# Patient Record
Sex: Female | Born: 1980 | Race: Black or African American | Hispanic: No | Marital: Single | Smoking: Never smoker
Health system: Southern US, Community
[De-identification: ages and names within clinical notes are randomized; demographics above are authoritative.]

## PROBLEM LIST (undated history)

## (undated) DIAGNOSIS — F419 Anxiety disorder, unspecified: Secondary | ICD-10-CM

## (undated) HISTORY — DX: Anxiety disorder, unspecified: F41.9

---

## 2017-08-30 DIAGNOSIS — Z9109 Other allergy status, other than to drugs and biological substances: Secondary | ICD-10-CM | POA: Insufficient documentation

## 2017-08-30 DIAGNOSIS — Z833 Family history of diabetes mellitus: Secondary | ICD-10-CM | POA: Insufficient documentation

## 2021-10-05 ENCOUNTER — Other Ambulatory Visit: Payer: Self-pay | Admitting: Nurse Practitioner

## 2021-10-05 ENCOUNTER — Ambulatory Visit
Admission: EM | Admit: 2021-10-05 | Discharge: 2021-10-05 | Disposition: A | Discharge status: Home / Self Care | Payer: Federal, State, Local not specified - PPO | Attending: Emergency Medicine | Admitting: Emergency Medicine

## 2021-10-05 ENCOUNTER — Other Ambulatory Visit (HOSPITAL_COMMUNITY)
Admission: RE | Admit: 2021-10-05 | Discharge: 2021-10-05 | Disposition: A | Discharge status: Home / Self Care | Payer: Federal, State, Local not specified - PPO | Source: Ambulatory Visit | Attending: Nurse Practitioner | Admitting: Nurse Practitioner

## 2021-10-05 ENCOUNTER — Ambulatory Visit (INDEPENDENT_AMBULATORY_CARE_PROVIDER_SITE_OTHER): Payer: Federal, State, Local not specified - PPO

## 2021-10-05 DIAGNOSIS — S300XXA Contusion of lower back and pelvis, initial encounter: Secondary | ICD-10-CM

## 2021-10-05 DIAGNOSIS — M545 Low back pain, unspecified: Secondary | ICD-10-CM | POA: Diagnosis not present

## 2021-10-05 DIAGNOSIS — W182XXA Fall in (into) shower or empty bathtub, initial encounter: Secondary | ICD-10-CM | POA: Diagnosis not present

## 2021-10-05 DIAGNOSIS — Z124 Encounter for screening for malignant neoplasm of cervix: Secondary | ICD-10-CM | POA: Diagnosis not present

## 2021-10-05 DIAGNOSIS — Z043 Encounter for examination and observation following other accident: Secondary | ICD-10-CM | POA: Diagnosis not present

## 2021-10-05 DIAGNOSIS — Z01419 Encounter for gynecological examination (general) (routine) without abnormal findings: Secondary | ICD-10-CM | POA: Diagnosis not present

## 2021-10-05 NOTE — ED Provider Notes (Signed)
[facility]    CSN: [id number] Arrival date & time: 10/05/21  1557    HISTORY   Chief Complaint  Patient presents with   Fall   HPI Angelica Dalton is a 41 y.o. female. Patient presents to urgent care today stating that she fell in her shower a week ago and continues to have pain at her sacrum.  Patient states she is concerned that she broke her tailbone.  Patient denies pain in her upper and lower extremities otherwise.  Patient states she fell so fast that she did not have time to catch herself and is thankful that she did not hit her head on the toilet.  Patient states she is never fallen in the shower before.  Patient denies dizziness, loss of consciousness or shortness of breath associated with the fall.  The history is provided by the patient.   History reviewed. No pertinent past medical history. There are no problems to display for this patient.  History reviewed. No pertinent surgical history. OB History   No obstetric history on file.    Home Medications    Prior to Admission medications   Not on File    Family History History reviewed. No pertinent family history. Social History Social History   Tobacco Use   Smoking status: Never   Smokeless tobacco: Never  Vaping Use   Vaping Use: Never used  Substance Use Topics   Alcohol use: Yes   Drug use: Yes    Types: Marijuana   Allergies   Patient has no allergy information on record.  Review of Systems Review of Systems Pertinent findings noted in history of present illness.   Physical Exam Triage Vital Signs ED Triage Vitals  Enc Vitals Group     BP 02/20/21 0827 (!) 147/82     Pulse Rate 02/20/21 0827 72     Resp 02/20/21 0827 18     Temp 02/20/21 0827 98.3 F (36.8 C)     Temp Source 02/20/21 0827 Oral     SpO2 02/20/21 0827 98 %     Weight --      Height --      Head Circumference --      Peak Flow --      Pain Score 02/20/21 0826 5     Pain Loc --      Pain Edu? --       Excl. in GC? --   No data found.  Updated Vital Signs BP 126/86 (BP Location: Left Arm)   Pulse 83   Temp 99 F (37.2 C) (Oral)   Resp 16   LMP 09/15/2021   SpO2 97%   Physical Exam Vitals and nursing note reviewed.  Constitutional:      General: She is not in acute distress.    Appearance: Normal appearance. She is obese. She is not ill-appearing.  HENT:     Head: Normocephalic and atraumatic.  Eyes:     General: Lids are normal.        Right eye: No discharge.        Left eye: No discharge.     Extraocular Movements: Extraocular movements intact.     Conjunctiva/sclera: Conjunctivae normal.     Right eye: Right conjunctiva is not injected.     Left eye: Left conjunctiva is not injected.  Neck:     Trachea: Trachea and phonation normal.  Cardiovascular:     Rate and Rhythm: Normal rate and regular rhythm.  Pulses: Normal pulses.     Heart sounds: Normal heart sounds. No murmur heard.    No friction rub. No gallop.  Pulmonary:     Effort: Pulmonary effort is normal. No accessory muscle usage, prolonged expiration or respiratory distress.     Breath sounds: Normal breath sounds. No stridor, decreased air movement or transmitted upper airway sounds. No decreased breath sounds, wheezing, rhonchi or rales.  Chest:     Chest wall: No tenderness.  Musculoskeletal:        General: Normal range of motion.     Cervical back: Normal range of motion and neck supple. Normal range of motion.     Lumbar back: Tenderness (Ecchymoses at base of spine) and bony tenderness present. No swelling, edema, deformity, signs of trauma, lacerations or spasms. Normal range of motion. Negative right straight leg raise test and negative left straight leg raise test. No scoliosis.  Lymphadenopathy:     Cervical: No cervical adenopathy.  Skin:    General: Skin is warm and dry.     Findings: No erythema or rash.  Neurological:     General: No focal deficit present.     Mental Status: She is  alert and oriented to person, place, and time.  Psychiatric:        Mood and Affect: Mood normal.        Behavior: Behavior normal.     Visual Acuity Right Eye Distance:   Left Eye Distance:   Bilateral Distance:    Right Eye Near:   Left Eye Near:    Bilateral Near:     [facility] Couse / Diagnostics / Procedures:    EKG  Radiology DG Lumbar Spine Complete  Result Date: 10/05/2021 CLINICAL DATA:  Fall. EXAM: LUMBAR SPINE - COMPLETE 4+ VIEW COMPARISON:  None Available. FINDINGS: There is no evidence of lumbar spine fracture. Alignment is normal. Intervertebral disc spaces are maintained. IMPRESSION: Negative. Electronically Signed   By: Darliss CheneyAmy  Guttmann M.D.   On: 10/05/2021 17:28    Procedures Procedures (including critical care time)  UC Diagnoses / Final Clinical Impressions(s)   I have reviewed the triage vital signs and the nursing notes.  Pertinent labs & imaging results that were available during my care of the patient were reviewed by me and considered in my medical decision making (see chart for details).    Final diagnoses:  Fall in shower  Contusion of sacrum, initial encounter    Patient was advised of negative x-ray findings, recommended conservative care with ice, heat, anti-inflammatory pain medication.  Return precautions advised. ED Prescriptions   None    PDMP not reviewed this encounter.  Pending results:  Labs Reviewed - No data to display  Medications Ordered in UC: Medications - No data to display  Disposition Upon Discharge:  Condition: stable for discharge home Home: take medications as prescribed; routine discharge instructions as discussed; follow up as advised.  Patient presented with an acute illness with associated systemic symptoms and significant discomfort requiring urgent management. In my opinion, this is a condition that a prudent lay person (someone who possesses an average knowledge of health and medicine) may potentially expect to  result in complications if not addressed urgently such as respiratory distress, impairment of bodily function or dysfunction of bodily organs.   Routine symptom specific, illness specific and/or disease specific instructions were discussed with the patient and/or caregiver at length.   As such, the patient has been evaluated and assessed, work-up was performed and treatment  was provided in alignment with urgent care protocols and evidence based medicine.  Patient/parent/caregiver has been advised that the patient may require follow up for further testing and treatment if the symptoms continue in spite of treatment, as clinically indicated and appropriate.  If the patient was tested for COVID-19, Influenza and/or RSV, then the patient/parent/guardian was advised to isolate at home pending the results of his/her diagnostic coronavirus test and potentially longer if they're positive. I have also advised pt that if his/her COVID-19 test returns positive, it's recommended to self-isolate for at least 10 days after symptoms first appeared AND until fever-free for 24 hours without fever reducer AND other symptoms have improved or resolved. Discussed self-isolation recommendations as well as instructions for household member/close contacts as per the [facility] and [place] DHHS, and also gave patient the COVID packet with this information.  Patient/parent/caregiver has been advised to return to the [facility] or PCP in 3-5 days if no better; to PCP or the Emergency Department if new signs and symptoms develop, or if the current signs or symptoms continue to change or worsen for further workup, evaluation and treatment as clinically indicated and appropriate  The patient will follow up with their current PCP if and as advised. If the patient does not currently have a PCP we will assist them in obtaining one.   The patient may need specialty follow up if the symptoms continue, in spite of conservative treatment and management, for  further workup, evaluation, consultation and treatment as clinically indicated and appropriate.   Patient/parent/caregiver verbalized understanding and agreement of plan as discussed.  All questions were addressed during visit.  Please see discharge instructions below for further details of plan.  Discharge Instructions:   Discharge Instructions      The x-ray of your lumbar spine and sacrum is not concerning for any bony injury.  Your hip bones and tailbone's are also intact.  It is very likely that you have a significant amount of bruising in the soft tissue and muscles surrounding your tailbone which is a source of your pain.  It is not surprising that after a week your still having pain because sometimes bruising like this can take several weeks to heal.  As we discussed, I do believe that ice at this point may be more beneficial than heat but if you find that alternating these 2 every hour provided you with relief I think that would be fine.  Do not recommend using heat only.  You are welcome to continue using ibuprofen and Tylenol as needed for pain.  Try to avoid being seated for long periods of time or lying on your back.  Please follow-up with your primary care provider if you still have not had meaningful relief of your pain in the next 7 to 10 days.  Thank you for visiting urgent care today.      This office note has been dictated using Teaching laboratory technician.  Unfortunately, and despite my best efforts, this method of dictation can sometimes lead to occasional typographical or grammatical errors.  I apologize in advance if this occurs.     Theadora Rama Scales, PA-C 10/05/21 1737

## 2021-10-05 NOTE — Discharge Instructions (Signed)
The x-ray of your lumbar spine and sacrum is not concerning for any bony injury.  Your hip bones and tailbone's are also intact.  It is very likely that you have a significant amount of bruising in the soft tissue and muscles surrounding your tailbone which is a source of your pain.  It is not surprising that after a week your still having pain because sometimes bruising like this can take several weeks to heal.  As we discussed, I do believe that ice at this point may be more beneficial than heat but if you find that alternating these 2 every hour provided you with relief I think that would be fine.  Do not recommend using heat only.  You are welcome to continue using ibuprofen and Tylenol as needed for pain.  Try to avoid being seated for long periods of time or lying on your back.  Please follow-up with your primary care provider if you still have not had meaningful relief of your pain in the next 7 to 10 days.  Thank you for visiting urgent care today.

## 2021-10-05 NOTE — ED Triage Notes (Signed)
Pt states she had a fall (fell on her buttocks) about a week ago and is still having pain to the area.

## 2021-10-06 LAB — CYTOLOGY - PAP
Comment: NEGATIVE
Diagnosis: NEGATIVE
High risk HPV: NEGATIVE

## 2021-10-08 DIAGNOSIS — N852 Hypertrophy of uterus: Secondary | ICD-10-CM | POA: Diagnosis not present

## 2021-10-09 ENCOUNTER — Other Ambulatory Visit: Payer: Self-pay | Admitting: Nurse Practitioner

## 2021-10-09 DIAGNOSIS — R1900 Intra-abdominal and pelvic swelling, mass and lump, unspecified site: Secondary | ICD-10-CM

## 2021-11-05 ENCOUNTER — Inpatient Hospital Stay: Admission: RE | Admit: 2021-11-05 | Payer: Federal, State, Local not specified - PPO | Source: Ambulatory Visit

## 2021-11-11 DIAGNOSIS — M47817 Spondylosis without myelopathy or radiculopathy, lumbosacral region: Secondary | ICD-10-CM | POA: Diagnosis not present

## 2021-11-11 DIAGNOSIS — M542 Cervicalgia: Secondary | ICD-10-CM | POA: Diagnosis not present

## 2021-11-11 DIAGNOSIS — M9903 Segmental and somatic dysfunction of lumbar region: Secondary | ICD-10-CM | POA: Diagnosis not present

## 2021-11-11 DIAGNOSIS — M9904 Segmental and somatic dysfunction of sacral region: Secondary | ICD-10-CM | POA: Diagnosis not present

## 2021-11-12 DIAGNOSIS — M9903 Segmental and somatic dysfunction of lumbar region: Secondary | ICD-10-CM | POA: Diagnosis not present

## 2021-11-12 DIAGNOSIS — M47817 Spondylosis without myelopathy or radiculopathy, lumbosacral region: Secondary | ICD-10-CM | POA: Diagnosis not present

## 2021-11-12 DIAGNOSIS — M9904 Segmental and somatic dysfunction of sacral region: Secondary | ICD-10-CM | POA: Diagnosis not present

## 2021-11-12 DIAGNOSIS — M542 Cervicalgia: Secondary | ICD-10-CM | POA: Diagnosis not present

## 2021-11-16 DIAGNOSIS — M9904 Segmental and somatic dysfunction of sacral region: Secondary | ICD-10-CM | POA: Diagnosis not present

## 2021-11-16 DIAGNOSIS — M47817 Spondylosis without myelopathy or radiculopathy, lumbosacral region: Secondary | ICD-10-CM | POA: Diagnosis not present

## 2021-11-16 DIAGNOSIS — M542 Cervicalgia: Secondary | ICD-10-CM | POA: Diagnosis not present

## 2021-11-16 DIAGNOSIS — M9903 Segmental and somatic dysfunction of lumbar region: Secondary | ICD-10-CM | POA: Diagnosis not present

## 2021-11-18 DIAGNOSIS — M47817 Spondylosis without myelopathy or radiculopathy, lumbosacral region: Secondary | ICD-10-CM | POA: Diagnosis not present

## 2021-11-18 DIAGNOSIS — M542 Cervicalgia: Secondary | ICD-10-CM | POA: Diagnosis not present

## 2021-11-18 DIAGNOSIS — M9904 Segmental and somatic dysfunction of sacral region: Secondary | ICD-10-CM | POA: Diagnosis not present

## 2021-11-18 DIAGNOSIS — M9903 Segmental and somatic dysfunction of lumbar region: Secondary | ICD-10-CM | POA: Diagnosis not present

## 2021-11-19 DIAGNOSIS — M542 Cervicalgia: Secondary | ICD-10-CM | POA: Diagnosis not present

## 2021-11-19 DIAGNOSIS — M9904 Segmental and somatic dysfunction of sacral region: Secondary | ICD-10-CM | POA: Diagnosis not present

## 2021-11-19 DIAGNOSIS — M47817 Spondylosis without myelopathy or radiculopathy, lumbosacral region: Secondary | ICD-10-CM | POA: Diagnosis not present

## 2021-11-19 DIAGNOSIS — M9903 Segmental and somatic dysfunction of lumbar region: Secondary | ICD-10-CM | POA: Diagnosis not present

## 2021-11-23 DIAGNOSIS — M9904 Segmental and somatic dysfunction of sacral region: Secondary | ICD-10-CM | POA: Diagnosis not present

## 2021-11-23 DIAGNOSIS — M47817 Spondylosis without myelopathy or radiculopathy, lumbosacral region: Secondary | ICD-10-CM | POA: Diagnosis not present

## 2021-11-23 DIAGNOSIS — M9903 Segmental and somatic dysfunction of lumbar region: Secondary | ICD-10-CM | POA: Diagnosis not present

## 2021-11-23 DIAGNOSIS — M542 Cervicalgia: Secondary | ICD-10-CM | POA: Diagnosis not present

## 2021-11-25 DIAGNOSIS — M9903 Segmental and somatic dysfunction of lumbar region: Secondary | ICD-10-CM | POA: Diagnosis not present

## 2021-11-25 DIAGNOSIS — M542 Cervicalgia: Secondary | ICD-10-CM | POA: Diagnosis not present

## 2021-11-25 DIAGNOSIS — M47817 Spondylosis without myelopathy or radiculopathy, lumbosacral region: Secondary | ICD-10-CM | POA: Diagnosis not present

## 2021-11-25 DIAGNOSIS — M9904 Segmental and somatic dysfunction of sacral region: Secondary | ICD-10-CM | POA: Diagnosis not present

## 2021-11-30 DIAGNOSIS — M9904 Segmental and somatic dysfunction of sacral region: Secondary | ICD-10-CM | POA: Diagnosis not present

## 2021-11-30 DIAGNOSIS — M542 Cervicalgia: Secondary | ICD-10-CM | POA: Diagnosis not present

## 2021-11-30 DIAGNOSIS — M9903 Segmental and somatic dysfunction of lumbar region: Secondary | ICD-10-CM | POA: Diagnosis not present

## 2021-11-30 DIAGNOSIS — M47817 Spondylosis without myelopathy or radiculopathy, lumbosacral region: Secondary | ICD-10-CM | POA: Diagnosis not present

## 2021-12-07 DIAGNOSIS — M9904 Segmental and somatic dysfunction of sacral region: Secondary | ICD-10-CM | POA: Diagnosis not present

## 2021-12-07 DIAGNOSIS — M542 Cervicalgia: Secondary | ICD-10-CM | POA: Diagnosis not present

## 2021-12-07 DIAGNOSIS — M9903 Segmental and somatic dysfunction of lumbar region: Secondary | ICD-10-CM | POA: Diagnosis not present

## 2021-12-07 DIAGNOSIS — M47817 Spondylosis without myelopathy or radiculopathy, lumbosacral region: Secondary | ICD-10-CM | POA: Diagnosis not present

## 2021-12-09 DIAGNOSIS — M542 Cervicalgia: Secondary | ICD-10-CM | POA: Diagnosis not present

## 2021-12-09 DIAGNOSIS — M47817 Spondylosis without myelopathy or radiculopathy, lumbosacral region: Secondary | ICD-10-CM | POA: Diagnosis not present

## 2021-12-09 DIAGNOSIS — M9904 Segmental and somatic dysfunction of sacral region: Secondary | ICD-10-CM | POA: Diagnosis not present

## 2021-12-09 DIAGNOSIS — M9903 Segmental and somatic dysfunction of lumbar region: Secondary | ICD-10-CM | POA: Diagnosis not present

## 2021-12-14 DIAGNOSIS — M542 Cervicalgia: Secondary | ICD-10-CM | POA: Diagnosis not present

## 2021-12-14 DIAGNOSIS — M9903 Segmental and somatic dysfunction of lumbar region: Secondary | ICD-10-CM | POA: Diagnosis not present

## 2021-12-14 DIAGNOSIS — M9904 Segmental and somatic dysfunction of sacral region: Secondary | ICD-10-CM | POA: Diagnosis not present

## 2021-12-14 DIAGNOSIS — M47817 Spondylosis without myelopathy or radiculopathy, lumbosacral region: Secondary | ICD-10-CM | POA: Diagnosis not present

## 2021-12-21 DIAGNOSIS — M9904 Segmental and somatic dysfunction of sacral region: Secondary | ICD-10-CM | POA: Diagnosis not present

## 2021-12-21 DIAGNOSIS — M47817 Spondylosis without myelopathy or radiculopathy, lumbosacral region: Secondary | ICD-10-CM | POA: Diagnosis not present

## 2021-12-21 DIAGNOSIS — M542 Cervicalgia: Secondary | ICD-10-CM | POA: Diagnosis not present

## 2021-12-21 DIAGNOSIS — M9903 Segmental and somatic dysfunction of lumbar region: Secondary | ICD-10-CM | POA: Diagnosis not present

## 2021-12-23 DIAGNOSIS — M9904 Segmental and somatic dysfunction of sacral region: Secondary | ICD-10-CM | POA: Diagnosis not present

## 2021-12-23 DIAGNOSIS — M542 Cervicalgia: Secondary | ICD-10-CM | POA: Diagnosis not present

## 2021-12-23 DIAGNOSIS — M9903 Segmental and somatic dysfunction of lumbar region: Secondary | ICD-10-CM | POA: Diagnosis not present

## 2021-12-23 DIAGNOSIS — M47817 Spondylosis without myelopathy or radiculopathy, lumbosacral region: Secondary | ICD-10-CM | POA: Diagnosis not present

## 2022-01-18 DIAGNOSIS — M9903 Segmental and somatic dysfunction of lumbar region: Secondary | ICD-10-CM | POA: Diagnosis not present

## 2022-01-18 DIAGNOSIS — M9904 Segmental and somatic dysfunction of sacral region: Secondary | ICD-10-CM | POA: Diagnosis not present

## 2022-01-18 DIAGNOSIS — M542 Cervicalgia: Secondary | ICD-10-CM | POA: Diagnosis not present

## 2022-01-18 DIAGNOSIS — M47817 Spondylosis without myelopathy or radiculopathy, lumbosacral region: Secondary | ICD-10-CM | POA: Diagnosis not present

## 2022-01-21 DIAGNOSIS — M9903 Segmental and somatic dysfunction of lumbar region: Secondary | ICD-10-CM | POA: Diagnosis not present

## 2022-01-21 DIAGNOSIS — M47817 Spondylosis without myelopathy or radiculopathy, lumbosacral region: Secondary | ICD-10-CM | POA: Diagnosis not present

## 2022-01-21 DIAGNOSIS — M9904 Segmental and somatic dysfunction of sacral region: Secondary | ICD-10-CM | POA: Diagnosis not present

## 2022-01-21 DIAGNOSIS — M542 Cervicalgia: Secondary | ICD-10-CM | POA: Diagnosis not present

## 2022-01-25 DIAGNOSIS — M9903 Segmental and somatic dysfunction of lumbar region: Secondary | ICD-10-CM | POA: Diagnosis not present

## 2022-01-25 DIAGNOSIS — M47817 Spondylosis without myelopathy or radiculopathy, lumbosacral region: Secondary | ICD-10-CM | POA: Diagnosis not present

## 2022-01-25 DIAGNOSIS — M542 Cervicalgia: Secondary | ICD-10-CM | POA: Diagnosis not present

## 2022-01-25 DIAGNOSIS — M9904 Segmental and somatic dysfunction of sacral region: Secondary | ICD-10-CM | POA: Diagnosis not present

## 2022-03-02 DIAGNOSIS — M542 Cervicalgia: Secondary | ICD-10-CM | POA: Diagnosis not present

## 2022-03-02 DIAGNOSIS — M9903 Segmental and somatic dysfunction of lumbar region: Secondary | ICD-10-CM | POA: Diagnosis not present

## 2022-03-02 DIAGNOSIS — M9904 Segmental and somatic dysfunction of sacral region: Secondary | ICD-10-CM | POA: Diagnosis not present

## 2022-03-02 DIAGNOSIS — M47817 Spondylosis without myelopathy or radiculopathy, lumbosacral region: Secondary | ICD-10-CM | POA: Diagnosis not present

## 2022-03-08 DIAGNOSIS — M9904 Segmental and somatic dysfunction of sacral region: Secondary | ICD-10-CM | POA: Diagnosis not present

## 2022-03-08 DIAGNOSIS — M9903 Segmental and somatic dysfunction of lumbar region: Secondary | ICD-10-CM | POA: Diagnosis not present

## 2022-03-08 DIAGNOSIS — M47817 Spondylosis without myelopathy or radiculopathy, lumbosacral region: Secondary | ICD-10-CM | POA: Diagnosis not present

## 2022-03-08 DIAGNOSIS — M542 Cervicalgia: Secondary | ICD-10-CM | POA: Diagnosis not present

## 2022-10-25 DIAGNOSIS — M9904 Segmental and somatic dysfunction of sacral region: Secondary | ICD-10-CM | POA: Diagnosis not present

## 2022-10-25 DIAGNOSIS — M542 Cervicalgia: Secondary | ICD-10-CM | POA: Diagnosis not present

## 2022-10-25 DIAGNOSIS — M47817 Spondylosis without myelopathy or radiculopathy, lumbosacral region: Secondary | ICD-10-CM | POA: Diagnosis not present

## 2022-10-25 DIAGNOSIS — M9903 Segmental and somatic dysfunction of lumbar region: Secondary | ICD-10-CM | POA: Diagnosis not present

## 2022-11-08 DIAGNOSIS — M9904 Segmental and somatic dysfunction of sacral region: Secondary | ICD-10-CM | POA: Diagnosis not present

## 2022-11-08 DIAGNOSIS — M542 Cervicalgia: Secondary | ICD-10-CM | POA: Diagnosis not present

## 2022-11-08 DIAGNOSIS — M9903 Segmental and somatic dysfunction of lumbar region: Secondary | ICD-10-CM | POA: Diagnosis not present

## 2022-11-08 DIAGNOSIS — M47817 Spondylosis without myelopathy or radiculopathy, lumbosacral region: Secondary | ICD-10-CM | POA: Diagnosis not present

## 2023-06-26 ENCOUNTER — Other Ambulatory Visit: Payer: Self-pay

## 2023-06-26 ENCOUNTER — Encounter (HOSPITAL_COMMUNITY): Payer: Self-pay

## 2023-06-26 ENCOUNTER — Emergency Department (HOSPITAL_COMMUNITY)

## 2023-06-26 ENCOUNTER — Emergency Department (HOSPITAL_COMMUNITY)
Admission: EM | Admit: 2023-06-26 | Discharge: 2023-06-26 | Disposition: A | Discharge status: Home / Self Care | Attending: Emergency Medicine | Admitting: Emergency Medicine

## 2023-06-26 DIAGNOSIS — R079 Chest pain, unspecified: Secondary | ICD-10-CM | POA: Diagnosis not present

## 2023-06-26 DIAGNOSIS — R0789 Other chest pain: Secondary | ICD-10-CM | POA: Diagnosis not present

## 2023-06-26 LAB — BASIC METABOLIC PANEL
Anion gap: 10 (ref 5–15)
BUN: 10 mg/dL (ref 6–20)
CO2: 20 mmol/L — ABNORMAL LOW (ref 22–32)
Calcium: 9 mg/dL (ref 8.9–10.3)
Chloride: 108 mmol/L (ref 98–111)
Creatinine, Ser: 0.84 mg/dL (ref 0.44–1.00)
GFR, Estimated: >60 mL/min (ref >60)
Glucose, Bld: 114 mg/dL — ABNORMAL HIGH (ref 70–99)
Potassium: 3.7 mmol/L (ref 3.5–5.1)
Sodium: 138 mmol/L (ref 135–145)

## 2023-06-26 LAB — CBC
HCT: 38.6 % (ref 36.0–46.0)
Hemoglobin: 12.6 g/dL (ref 12.0–15.0)
MCH: 29.8 pg (ref 26.0–34.0)
MCHC: 32.6 g/dL (ref 30.0–36.0)
MCV: 91.3 fL (ref 80.0–100.0)
Platelets: 313 10*3/uL (ref 150–400)
RBC: 4.23 MIL/uL (ref 3.87–5.11)
RDW: 13.8 % (ref 11.5–15.5)
WBC: 5.8 10*3/uL (ref 4.0–10.5)
nRBC: 0 % (ref 0.0–0.2)

## 2023-06-26 LAB — TROPONIN I (HIGH SENSITIVITY): Troponin I (High Sensitivity): <2 ng/L (ref <18)

## 2023-06-26 LAB — HCG, SERUM, QUALITATIVE: Preg, Serum: NEGATIVE

## 2023-06-26 MED ORDER — HYDROXYZINE HCL 25 MG PO TABS: 25.0000 mg | ORAL_TABLET | Freq: Four times a day (QID) | ORAL | 0 refills | Status: DC

## 2023-06-26 NOTE — ED Provider Notes (Signed)
 Angelica Dalton EMERGENCY DEPARTMENT AT [facility] Provider Note   CSN: [id number] Arrival date & time: 06/26/23  1231     History  Chief Complaint  Patient presents with   Chest Pain    Angelica Dalton is a 43 y.o. female.  43 year old female presents today for concern of chest pain.  Has been ongoing for the past 2 weeks.  Since yesterday it started radiating to the left shoulder blade.  No shortness of breath, nausea, lightheadedness.  No recent long road trips, or surgery.  No prior history of DVT or PE.  Patient states in 04-21-24 her dad passed away and since then she has had significant stressors in her life.  She came in today to rule out any concerning cause of her chest pain.  The history is provided by the patient. No language interpreter was used.       Home Medications Prior to Admission medications   Not on File      Allergies    Patient has no known allergies.    Review of Systems   Review of Systems  Constitutional:  Negative for chills and fever.  Respiratory:  Negative for shortness of breath.   Cardiovascular:  Positive for chest pain.  Gastrointestinal:  Negative for abdominal pain.  Neurological:  Negative for light-headedness.  All other systems reviewed and are negative.   Physical Exam Updated Vital Signs BP 112/72   Pulse 88   Temp 98.4 F (36.9 C)   Resp 18   Ht 5\' 4"  (1.626 m)   Wt 106.6 kg   LMP 06/06/2023 (Approximate)   SpO2 100%   BMI 40.34 kg/m  Physical Exam Vitals and nursing note reviewed.  Constitutional:      General: She is not in acute distress.    Appearance: Normal appearance. She is not ill-appearing.  HENT:     Head: Normocephalic and atraumatic.     Nose: Nose normal.  Eyes:     Conjunctiva/sclera: Conjunctivae normal.  Cardiovascular:     Rate and Rhythm: Normal rate and regular rhythm.  Pulmonary:     Effort: Pulmonary effort is normal. No respiratory distress.  Musculoskeletal:        General: No  deformity.  Skin:    Findings: No rash.  Neurological:     Mental Status: She is alert.     ED Results / Procedures / Treatments   Labs (all labs ordered are listed, but only abnormal results are displayed) Labs Reviewed  BASIC METABOLIC PANEL - Abnormal; Notable for the following components:      Result Value   CO2 20 (*)    Glucose, Bld 114 (*)    All other components within normal limits  CBC  HCG, SERUM, QUALITATIVE  TROPONIN I (HIGH SENSITIVITY)    EKG EKG Interpretation Date/Time:  Sunday June 26 2023 12:38:52 EST Ventricular Rate:  87 PR Interval:  144 QRS Duration:  82 QT Interval:  342 QTC Calculation: 411 R Axis:   86  Text Interpretation: Normal sinus rhythm Normal ECG No previous ECGs available Confirmed by Jacalyn Lefevre [phone]) on 06/26/2023 1:24:30 PM  Radiology DG Chest 2 View Result Date: 06/26/2023 CLINICAL DATA:  Chest pain on and off for 2 weeks. EXAM: CHEST - 2 VIEW COMPARISON:  None Available. FINDINGS: The heart size and mediastinal contours are within normal limits. Both lungs are clear. The visualized skeletal structures are unremarkable. IMPRESSION: No active cardiopulmonary disease. Electronically Signed   By: Joselyn Glassman  Litton M.D.   On: 06/26/2023 13:15    Procedures Procedures    Medications Ordered in ED Medications - No data to display  ED Course/ Medical Decision Making/ A&P                                 Medical Decision Making Amount and/or Complexity of Data Reviewed Labs: ordered. Radiology: ordered.  Risk Prescription drug management.   Medical Decision Making / ED Course   This patient presents to the ED for concern of chest pain, this involves an extensive number of treatment options, and is a complaint that carries with it a high risk of complications and morbidity.  The differential diagnosis includes ACS, PE, pneumonia, anxiety, MSK etiology, GERD  MDM: 43 year old female presents today for concern of 2-week  duration of chest pain.  Radiates to left shoulder blade since yesterday.  No prior history of CAD.  Endorses significant life stressor.  Hemodynamically stable.  Without acute distress.  She states the pain is not severe but it is noticeable.  Low heart score.  CBC unremarkable, BMP with glucose of 114 otherwise without acute concern.  Pregnancy test negative.  Troponin undetectable.  Chest x-ray without acute cardiopulmonary process.  EKG without acute ischemic change. Low suspicion for ACS.  Low risk for PE. Chest pain could be anxiety related.  Patient is attributing her chest pain to this but wanted to ensure there was nothing more concerning.  Pain is not reproducible.  Lower likelihood that it is MSK in nature.  Will give Atarax.  Will give PCP referral.  Patient is stable for discharge.  Discharged in stable condition.  Given symptoms have been ongoing for 2 weeks no need for repeat troponin.  Lab Tests: -I ordered, reviewed, and interpreted labs.   The pertinent results include:   Labs Reviewed  BASIC METABOLIC PANEL - Abnormal; Notable for the following components:      Result Value   CO2 20 (*)    Glucose, Bld 114 (*)    All other components within normal limits  CBC  HCG, SERUM, QUALITATIVE  TROPONIN I (HIGH SENSITIVITY)      EKG  EKG Interpretation Date/Time:  Sunday June 26 2023 12:38:52 EST Ventricular Rate:  87 PR Interval:  144 QRS Duration:  82 QT Interval:  342 QTC Calculation: 411 R Axis:   86  Text Interpretation: Normal sinus rhythm Normal ECG No previous ECGs available Confirmed by Jacalyn Lefevre [phone]) on 06/26/2023 1:24:30 PM         Imaging Studies ordered: I ordered imaging studies including chest x-ray I independently visualized and interpreted imaging. I agree with the radiologist interpretation   Medicines ordered and prescription drug management: No orders of the defined types were placed in this encounter.   -I have reviewed the patients  home medicines and have made adjustments as needed  Cardiac Monitoring: The patient was maintained on a cardiac monitor.  I personally viewed and interpreted the cardiac monitored which showed an underlying rhythm of: Normal sinus rhythm  Social Determinants of Health:  Factors impacting patients care include: No PCP   Reevaluation: After the interventions noted above, I reevaluated the patient and found that they have :stayed the same  Co morbidities that complicate the patient evaluation History reviewed. No pertinent past medical history.    Dispostion: Discharged in stable condition.  Return precaution discussed.  Patient voices understanding and is in  agreement with plan.   Final Clinical Impression(s) / ED Diagnoses Final diagnoses:  Atypical chest pain    Rx / DC Orders ED Discharge Orders          Ordered    hydrOXYzine (ATARAX) 25 MG tablet  Every 6 hours        06/26/23 1429              Marita [place], PA-C 06/26/23 1432    Jacalyn Lefevre, MD 06/27/23 [phone]

## 2023-06-26 NOTE — Discharge Instructions (Signed)
 Your workup today did not show any evidence of an emergent cause of your chest pain.  You stated you have had significant life stressors.  I could certainly also be playing a role.  I have sent anxiety medicine into the pharmacy for you.  Have also given you referral to a primary care clinic.  Please call and establish care with them tomorrow.  For any concerning symptoms please return to the emergency room.

## 2023-06-26 NOTE — ED Triage Notes (Signed)
 Pt c.o left sided chest pain x 1 week that now radiates to her shoulder blades along with ringing in her left ear. Denies SOB. No CP at this time

## 2023-06-29 ENCOUNTER — Ambulatory Visit: Admitting: Family Medicine

## 2023-06-29 ENCOUNTER — Encounter: Payer: Self-pay | Admitting: Family Medicine

## 2023-06-29 VITALS — BP 109/75 | HR 84 | Temp 98.1°F | Resp 18 | Ht 64.0 in | Wt 238.3 lb

## 2023-06-29 DIAGNOSIS — J302 Other seasonal allergic rhinitis: Secondary | ICD-10-CM | POA: Diagnosis not present

## 2023-06-29 DIAGNOSIS — K219 Gastro-esophageal reflux disease without esophagitis: Secondary | ICD-10-CM | POA: Diagnosis not present

## 2023-06-29 DIAGNOSIS — N764 Abscess of vulva: Secondary | ICD-10-CM

## 2023-06-29 DIAGNOSIS — Z7689 Persons encountering health services in other specified circumstances: Secondary | ICD-10-CM | POA: Diagnosis not present

## 2023-06-29 MED ORDER — PANTOPRAZOLE SODIUM 40 MG PO TBEC: 40.0000 mg | DELAYED_RELEASE_TABLET | Freq: Every day | ORAL | 0 refills | Status: AC

## 2023-06-29 MED ORDER — DOXYCYCLINE HYCLATE 100 MG PO TABS: 100.0000 mg | ORAL_TABLET | Freq: Two times a day (BID) | ORAL | 0 refills | Status: AC

## 2023-06-29 NOTE — Progress Notes (Signed)
 New Patient Office Visit  Subjective    Patient ID: Angelica Dalton, female    DOB: Oct 15, 1980  Age: 43 y.o. MRN: [id number]  CC:  Chief Complaint  Patient presents with   Establish Care    Patient is here to establish care with new PCP, Patient is here to discuss ER visit from 06/26/2023 for chest pain. Patient describes the pain at times was sharp and on the left side, she states that the pain had lingered around for a couple of weeks    HPI Angelica Dalton presents to establish care.  Chest pain Pt reports she's had lingering chest pains for the last few weeks. She went to ER on 3/2 for the chest pains.  She hads EKG which was normal. Labs was normal. She has it's been happening for the last year and she didn't know if it was stress. She reports it was sharp underneath the left breast and to her left shoulder. She reports it jolted her and stopped her from what she was doing. She didn't have SOB with the pain. She says the pain didn't radiate down her left arm. She says she was walking around in her kitchen when it came on. She reports GERD symptoms and has burped with fluid come back in her throat. She drinks sparkling water and alcohol occasionally.  She reports hx of seasonal allergies. She uses otc claritin prn. This doesn't really help her. She reports she always has watery eyes.   She has hx of groin boils that come and go. She has one now that is quarter size and painful to touch. She has scarring from the boils. She reports this boil is tender and has been present for the last month.   Outpatient Encounter Medications as of 06/29/2023  Medication Sig   [DISCONTINUED] hydrOXYzine (ATARAX) 25 MG tablet Take 1 tablet (25 mg total) by mouth every 6 (six) hours.   No facility-administered encounter medications on file as of 06/29/2023.    Past Medical History:  Diagnosis Date   Anxiety     History reviewed. No pertinent surgical history.  Family History  Problem Relation Age of  Onset   Asthma Mother    Heart disease Mother    Diabetes Father    Stroke Father     Social History   Socioeconomic History   Marital status: Single    Spouse name: Not on file   Number of children: Not on file   Years of education: Not on file   Highest education level: Bachelor's degree (e.g., BA, AB, BS)  Occupational History   Not on file  Tobacco Use   Smoking status: Never    Passive exposure: Never   Smokeless tobacco: Never  Vaping Use   Vaping status: Never Used  Substance and Sexual Activity   Alcohol use: Yes    Alcohol/week: 2.0 standard drinks of alcohol    Types: 2 Standard drinks or equivalent per week   Drug use: Not Currently    Types: Marijuana   Sexual activity: Not Currently    Birth control/protection: None  Other Topics Concern   Not on file  Social History Narrative   Not on file   Social Drivers of Health   Financial Resource Strain: Low Risk  (06/27/2023)   Overall Financial Resource Strain (CARDIA)    Difficulty of Paying Living Expenses: Not hard at all  Food Insecurity: No Food Insecurity (06/27/2023)   Hunger Vital Sign    Worried About Running  Out of Food in the Last Year: Never true    Ran Out of Food in the Last Year: Never true  Transportation Needs: No Transportation Needs (06/27/2023)   PRAPARE - Administrator, Civil Service (Medical): No    Lack of Transportation (Non-Medical): No  Physical Activity: Insufficiently Active (06/27/2023)   Exercise Vital Sign    Days of Exercise per Week: 3 days    Minutes of Exercise per Session: 30 min  Stress: Stress Concern Present (06/27/2023)   [employer] of Occupational Health - Occupational Stress Questionnaire    Feeling of Stress : To some extent  Social Connections: Unknown (06/27/2023)   Social Connection and Isolation Panel [NHANES]    Frequency of Communication with Friends and Family: More than three times a week    Frequency of Social Gatherings with Friends and  Family: Patient declined    Attends Religious Services: Patient declined    Database administrator or Organizations: Yes    Attends Banker Meetings: Never    Marital Status: Never married  Intimate Partner Violence: Not At Risk (08/30/2017)   Received from [facility]    Humiliation, Afraid, Rape, and Kick questionnaire    Fear of Current or Ex-Partner: No    Emotionally Abused: No    Physically Abused: No    Sexually Abused: No    Review of Systems  HENT:  Positive for congestion.   Cardiovascular:  Positive for chest pain.  Gastrointestinal:  Positive for heartburn.  Skin:        Recurrent vulva boils  All other systems reviewed and are negative.       Objective    BP 109/75   Pulse 84   Temp 98.1 F (36.7 C) (Oral)   Resp 18   Ht 5\' 4"  (1.626 m)   Wt 238 lb 4.8 oz (108.1 kg)   LMP 06/06/2023 (Approximate)   SpO2 100%   BMI 40.90 kg/m   Physical Exam Vitals and nursing note reviewed. Exam conducted with a chaperone present.  Constitutional:      Appearance: Normal appearance. She is obese.  HENT:     Head: Normocephalic and atraumatic.     Right Ear: External ear normal.     Left Ear: External ear normal.     Nose: Nose normal.     Mouth/Throat:     Mouth: Mucous membranes are moist.     Pharynx: Oropharynx is clear.  Eyes:     Conjunctiva/sclera: Conjunctivae normal.     Pupils: Pupils are equal, round, and reactive to light.  Cardiovascular:     Rate and Rhythm: Normal rate and regular rhythm.     Pulses: Normal pulses.     Heart sounds: Normal heart sounds.  Pulmonary:     Effort: Pulmonary effort is normal.     Breath sounds: Normal breath sounds.  Genitourinary:    General: Normal vulva.     Comments: Subcutaneous lump to left vulva, nondraining quarter size Skin:    General: Skin is warm.     Capillary Refill: Capillary refill takes less than 2 seconds.  Neurological:     General: No focal deficit present.     Mental  Status: She is alert and oriented to person, place, and time. Mental status is at baseline.  Psychiatric:        Mood and Affect: Mood normal.        Behavior: Behavior normal.  Thought Content: Thought content normal.        Judgment: Judgment normal.      Assessment & Plan:   Problem List Items Addressed This Visit   None  Encounter to establish care with new doctor  Gastroesophageal reflux disease without esophagitis -     Pantoprazole Sodium; Take 1 tablet (40 mg total) by mouth daily.  Dispense: 30 tablet; Refill: 0  Seasonal allergies  Boil of vulva -     Doxycycline Hyclate; Take 1 tablet (100 mg total) by mouth 2 (two) times daily for 7 days.  Dispense: 14 tablet; Refill: 0  Suspect chest pain a consequence of GERD. Trial of Pantoprazole 40mg  daily. See back in 3 weeks. Advised on diet changes avoid alcohol and acidic foods/drinks. If no better, will refer to cardiology for chest pain work up. For seasonal allergies, advised on trying to add flonase and /or eye drops to help with symptoms along with switching between Allegra, Claritin, or Zyrtec.  Pt with recurrent boils. This present for the last several months. Will try Doxycycline 100mg  BID for 7 days and sitz baths. Recheck In a few weeks. May need draining with specialist if no improvement.   No follow-ups on file.   Suzan Slick, MD

## 2023-07-20 ENCOUNTER — Ambulatory Visit: Admitting: Family Medicine

## 2024-06-09 IMAGING — DX DG LUMBAR SPINE COMPLETE 4+V
5 series · 5 of 5 positions shown · non-contrast
Comparison: None Available.

CLINICAL DATA: Fall.

EXAM:
LUMBAR SPINE - COMPLETE 4+ VIEW

[lumbar spine ap]
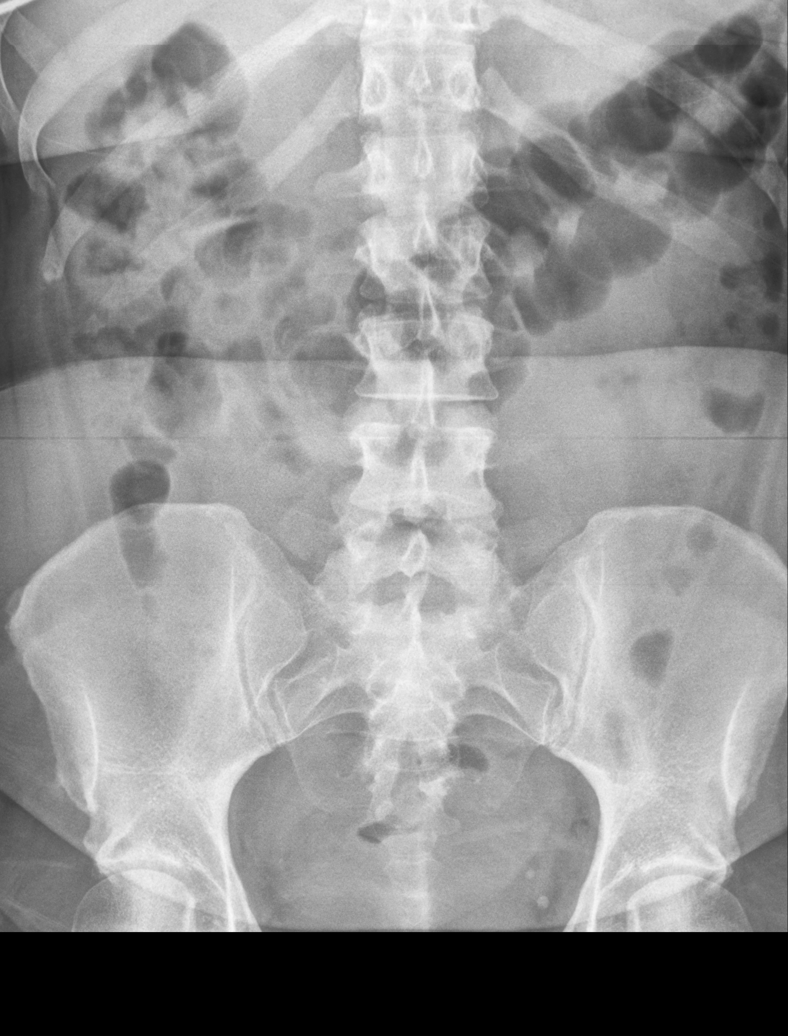

[lumbar spine rpo]
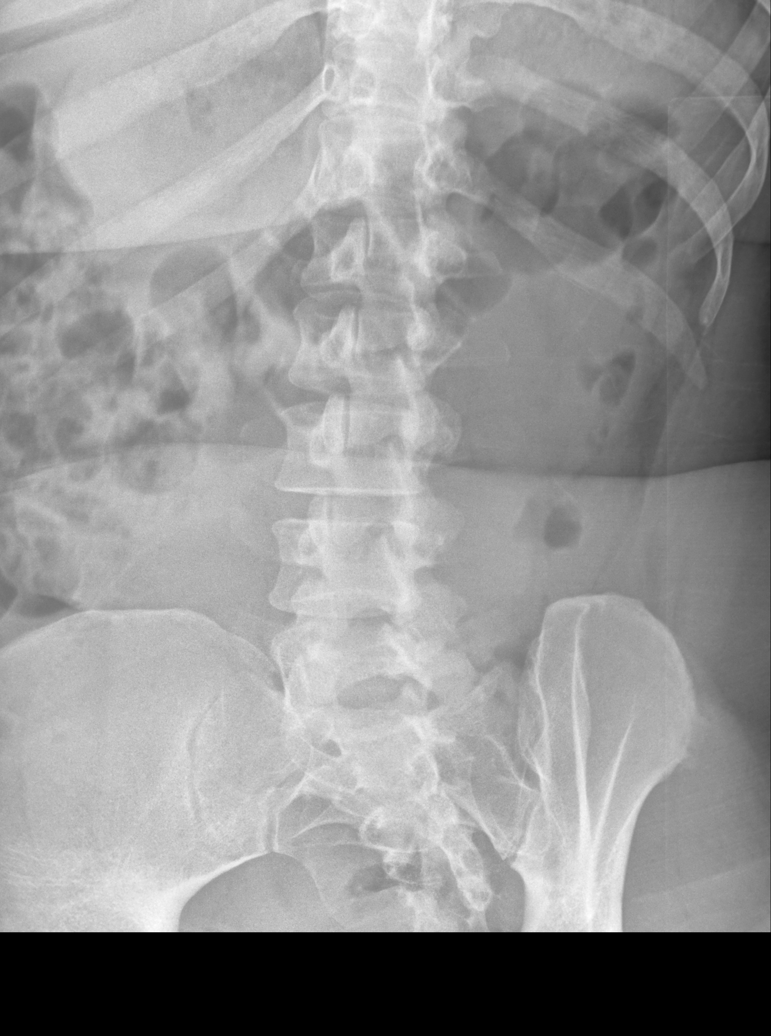

[lumbar spine lpo]
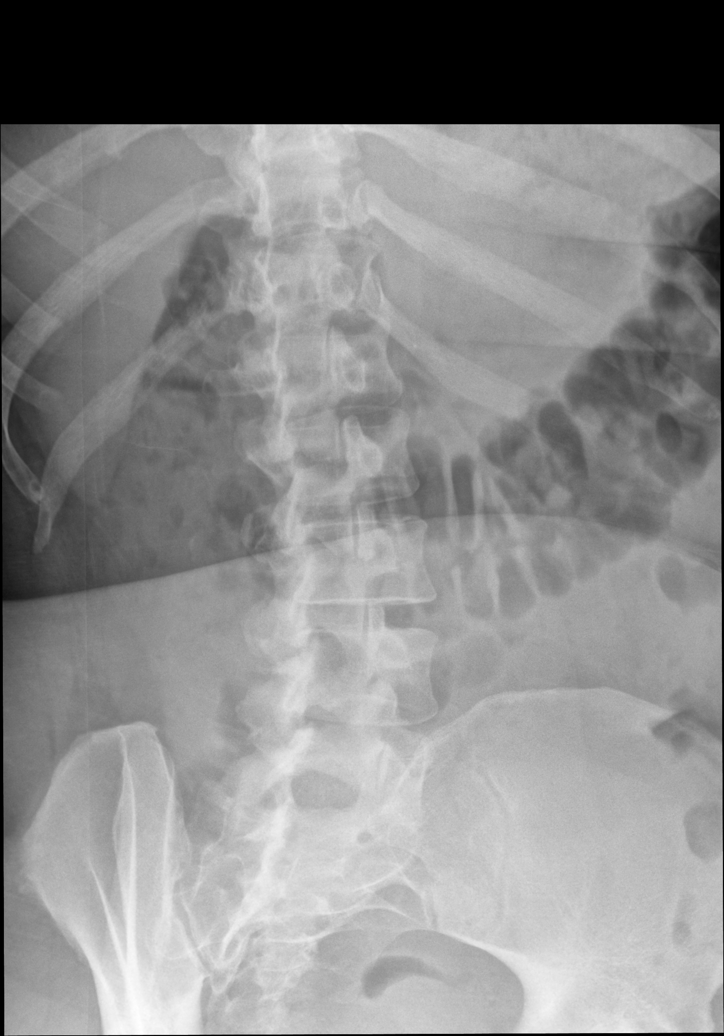

[lumbar spine lat]
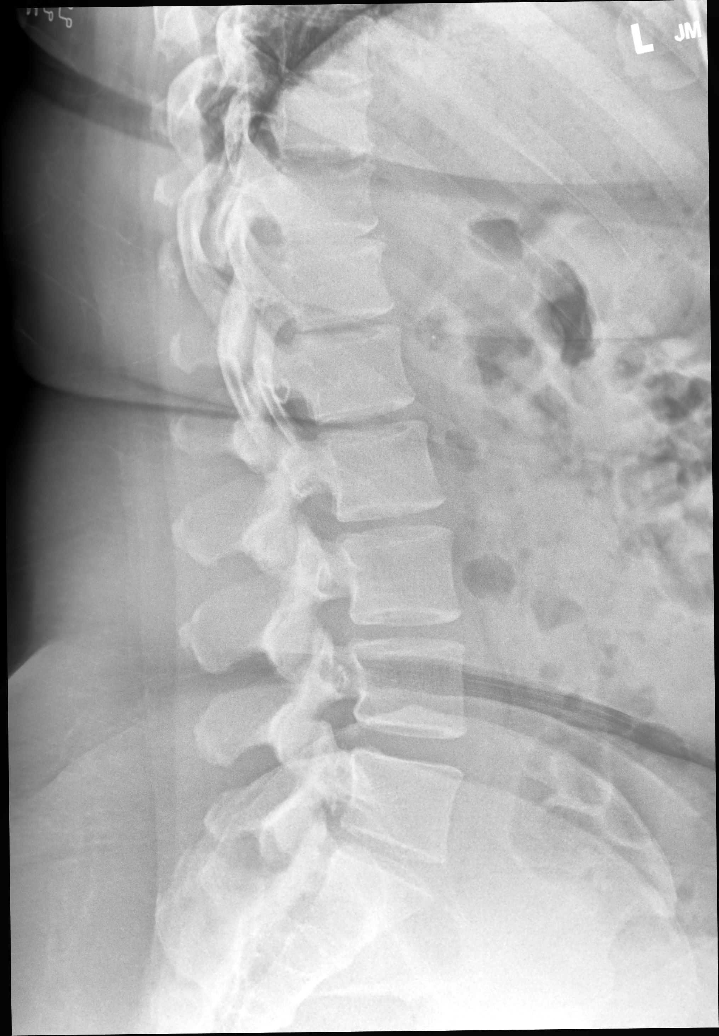

[lumbar spine l5-s1]
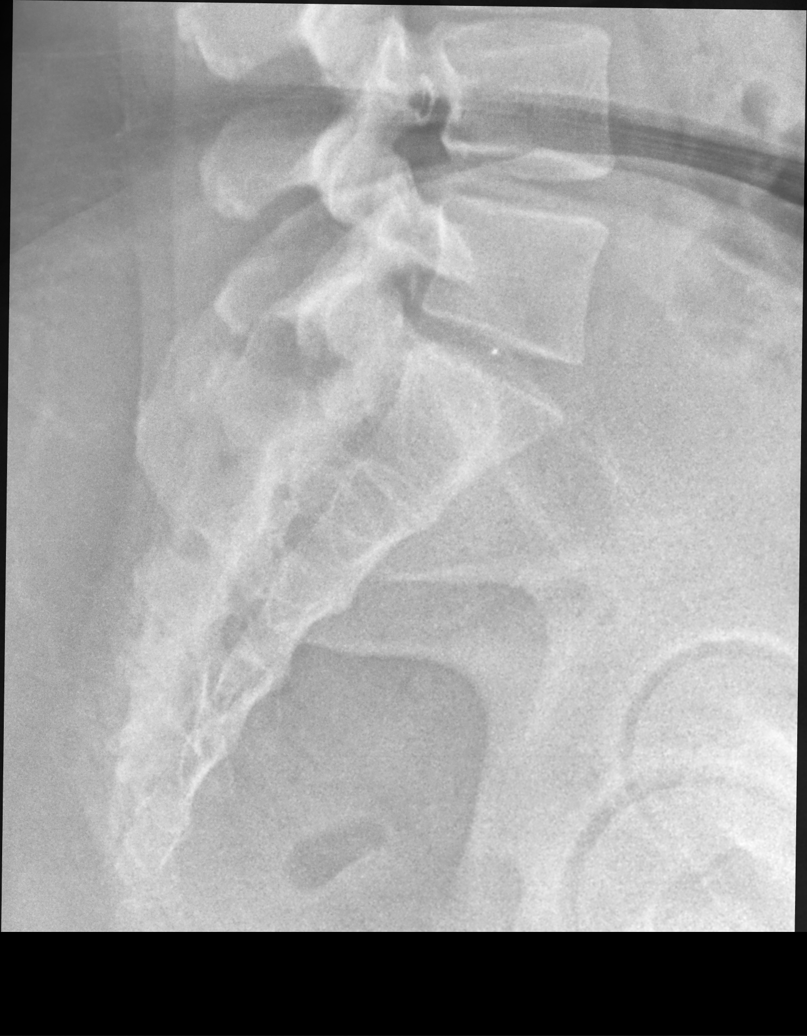

[5 of 5 positions shown; findings below may reference images not displayed]

FINDINGS: There is no evidence of lumbar spine fracture. Alignment is normal.
Intervertebral disc spaces are maintained.
IMPRESSION: Negative.
